# Patient Record
Sex: Female | Born: 2000 | Race: White | Hispanic: No | Smoking: Current every day smoker
Health system: Southern US, Community
[De-identification: ages and names within clinical notes are randomized; demographics above are authoritative.]

## PROBLEM LIST (undated history)

## (undated) DIAGNOSIS — M419 Scoliosis, unspecified: Secondary | ICD-10-CM

---
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
## 2018-06-01 ENCOUNTER — Emergency Department (HOSPITAL_BASED_OUTPATIENT_CLINIC_OR_DEPARTMENT_OTHER)
Admission: EM | Admit: 2018-06-01 | Discharge: 2018-06-01 | Disposition: A | Discharge status: Home / Self Care | Payer: 59 | Attending: Emergency Medicine | Admitting: Emergency Medicine

## 2018-06-01 ENCOUNTER — Other Ambulatory Visit: Payer: Self-pay

## 2018-06-01 ENCOUNTER — Encounter (HOSPITAL_BASED_OUTPATIENT_CLINIC_OR_DEPARTMENT_OTHER): Payer: Self-pay

## 2018-06-01 ENCOUNTER — Emergency Department (HOSPITAL_BASED_OUTPATIENT_CLINIC_OR_DEPARTMENT_OTHER): Payer: 59

## 2018-06-01 DIAGNOSIS — M25571 Pain in right ankle and joints of right foot: Secondary | ICD-10-CM

## 2018-06-01 DIAGNOSIS — F17200 Nicotine dependence, unspecified, uncomplicated: Secondary | ICD-10-CM | POA: Insufficient documentation

## 2018-06-01 DIAGNOSIS — W19XXXA Unspecified fall, initial encounter: Secondary | ICD-10-CM

## 2018-06-01 DIAGNOSIS — W108XXA Fall (on) (from) other stairs and steps, initial encounter: Secondary | ICD-10-CM | POA: Insufficient documentation

## 2018-06-01 MED ORDER — IBUPROFEN 800 MG PO TABS
800.0000 mg | ORAL_TABLET | Freq: Once | ORAL | Status: AC
  Administered 2018-06-01: 800 mg via ORAL
  Filled 2018-06-01: qty 1

## 2018-06-01 NOTE — ED Triage Notes (Signed)
Pt fell down steps approx 20 min PTA-pain to right ankle-NAD-to triage in w/c

## 2018-06-01 NOTE — Discharge Instructions (Addendum)
1. Medications: Take ibuprofen 3 times a day with meals.  Do not take other anti-inflammatories at the same time (Advil, Motrin, naproxen, Aleve). You may supplement with Tylenol if you need further pain control. 2. Treatment: Wear ankle brace for 1-2 weeks for stabilization of ankle. Use crutches as needed for comfort. Ice and elevate ankle throughout the day. 3. Follow-up: follow up with your primary care doctor in 1 week if you pain is not improving.  Return to the ER if your develop any new, worsening, or concerning symptoms.

## 2018-06-02 NOTE — ED Provider Notes (Signed)
[facility] EMERGENCY DEPARTMENT Provider Note   CSN: [id number] Arrival date & time: 06/01/18  2016     History   Chief Complaint Chief Complaint  Patient presents with  . Fall    HPI Rachel Sawyer is a 17 y.o. female presenting for evaluation of right ankle pain after a fall.  Patient states she was going upstairs when she missed a step, causing her to fall and she thinks he landed on her right ankle.  She reports acute onset of right ankle pain, which is been constant since.  This happened just prior to arrival.  She denies pain elsewhere.  She denies hitting her head or loss of consciousness.  She denies headache, vision changes, neck pain, back pain, chest pain, or abdominal pain.  She has pain on the left side.  Pain is of the lateral right ankle.  She has not attempted to ambulate since.  She has not taken anything for pain including Tylenol or ibuprofen.  Pain is constant, nothing makes it better or worse.  She has no other medical problems, takes no medications daily.  She is not on blood thinners.  No radiation of the pain.  HPI  History reviewed. No pertinent past medical history.  There are no active problems to display for this patient.   History reviewed. No pertinent surgical history.   OB History   None      Home Medications    Prior to Admission medications   Not on File    Family History No family history on file.  Social History Social History   Tobacco Use  . Smoking status: Current Every Day Smoker  . Smokeless tobacco: Never Used  Substance Use Topics  . Alcohol use: Never    Frequency: Never  . Drug use: Never     Allergies   Patient has no known allergies.   Review of Systems Review of Systems  Musculoskeletal: Positive for arthralgias.  Neurological: Negative for numbness.  Hematological: Does not bruise/bleed easily.     Physical Exam Updated Vital Signs BP (!) 124/91 (BP Location: Right Arm)   Pulse  81   Temp 98.9 F (37.2 C) (Oral)   Resp 18   Ht 5\' 5"  (1.651 m)   Wt 54.4 kg   LMP 05/08/2018   SpO2 100%   BMI 19.97 kg/m   Physical Exam  Constitutional: She is oriented to person, place, and time. She appears well-developed and well-nourished. No distress.  HENT:  Head: Normocephalic and atraumatic.  Eyes: EOM are normal.  Neck: Normal range of motion.  Pulmonary/Chest: Effort normal.  Abdominal: She exhibits no distension.  Musculoskeletal: Normal range of motion. She exhibits tenderness. She exhibits no edema.  No obvious swelling or deformity of the right ankle.  Tenderness palpation of the lateral malleolus.  Pedal pulses intact bilaterally.  Sensation intact bilaterally.  Good cap refill.  Increased pain with plantar flexion, no pain with other movements of the ankle.  Attempted to bear weight, patient reports significant increased pain.  Neurological: She is alert and oriented to person, place, and time. No sensory deficit.  Skin: Skin is warm. Capillary refill takes less than 2 seconds. No rash noted.  Psychiatric: She has a normal mood and affect.  Nursing note and vitals reviewed.    ED Treatments / Results  Labs (all labs ordered are listed, but only abnormal results are displayed) Labs Reviewed - No data to display  EKG None  Radiology Dg Ankle  Complete Right  Result Date: 06/01/2018 CLINICAL DATA:  Larey Seat down steps EXAM: RIGHT ANKLE - COMPLETE 3+ VIEW COMPARISON:  None. FINDINGS: There is no evidence of fracture, dislocation, or joint effusion. There is no evidence of arthropathy or other focal bone abnormality. Soft tissues are unremarkable. IMPRESSION: Negative. Electronically Signed   By: Jasmine Pang M.D.   On: 06/01/2018 21:38    Procedures Procedures (including critical care time)  Medications Ordered in ED Medications  ibuprofen (ADVIL,MOTRIN) tablet 800 mg (800 mg Oral Given 06/01/18 2309)     Initial Impression / Assessment and Plan / ED Course   I have reviewed the triage vital signs and the nursing notes.  Pertinent labs & imaging results that were available during my care of the patient were reviewed by me and considered in my medical decision making (see chart for details).     Patient presenting for evaluation of right ankle pain after fall.  Physical exam reassuring, she is neurovascularly intact.  No obvious deformities.  X-ray viewed interpreted by me, no fractures or dislocations.  Likely sprain.  Patient reporting difficulty bearing weight due to pain.  Will give ASO, crutches.  Dicussed use of NSAIDs, rest, ice, and elevation.  Discussed follow-up with primary care doctor if symptoms are not improving.  At this time, patient appears safe for discharge.  Return precautions given.  Patient states she understands and agrees plan.   Final Clinical Impressions(s) / ED Diagnoses   Final diagnoses:  Fall, initial encounter  Acute right ankle pain    ED Discharge Orders    None       Alveria Apley, PA-C 06/02/18 Laroy Apple, MD 06/08/18 [phone]

## 2019-08-17 ENCOUNTER — Other Ambulatory Visit: Payer: Self-pay

## 2019-08-17 ENCOUNTER — Emergency Department (HOSPITAL_COMMUNITY): Payer: BC Managed Care – PPO

## 2019-08-17 ENCOUNTER — Encounter (HOSPITAL_COMMUNITY): Payer: Self-pay | Admitting: Emergency Medicine

## 2019-08-17 ENCOUNTER — Emergency Department (HOSPITAL_COMMUNITY)
Admission: EM | Admit: 2019-08-17 | Discharge: 2019-08-18 | Disposition: A | Discharge status: Home / Self Care | Payer: BC Managed Care – PPO | Attending: Emergency Medicine | Admitting: Emergency Medicine

## 2019-08-17 DIAGNOSIS — R0789 Other chest pain: Secondary | ICD-10-CM

## 2019-08-17 DIAGNOSIS — R0602 Shortness of breath: Secondary | ICD-10-CM | POA: Insufficient documentation

## 2019-08-17 DIAGNOSIS — Z79899 Other long term (current) drug therapy: Secondary | ICD-10-CM | POA: Insufficient documentation

## 2019-08-17 DIAGNOSIS — F17210 Nicotine dependence, cigarettes, uncomplicated: Secondary | ICD-10-CM | POA: Insufficient documentation

## 2019-08-17 DIAGNOSIS — R079 Chest pain, unspecified: Secondary | ICD-10-CM | POA: Diagnosis present

## 2019-08-17 DIAGNOSIS — Z20828 Contact with and (suspected) exposure to other viral communicable diseases: Secondary | ICD-10-CM | POA: Insufficient documentation

## 2019-08-17 DIAGNOSIS — U071 COVID-19: Secondary | ICD-10-CM

## 2019-08-17 HISTORY — DX: Scoliosis, unspecified: M41.9

## 2019-08-17 LAB — CBC
HCT: 42.9 % (ref 36.0–46.0)
Hemoglobin: 14.4 g/dL (ref 12.0–15.0)
MCH: 30.9 pg (ref 26.0–34.0)
MCHC: 33.6 g/dL (ref 30.0–36.0)
MCV: 92.1 fL (ref 80.0–100.0)
Platelets: 280 10*3/uL (ref 150–400)
RBC: 4.66 MIL/uL (ref 3.87–5.11)
RDW: 13.1 % (ref 11.5–15.5)
WBC: 3.6 10*3/uL — ABNORMAL LOW (ref 4.0–10.5)
nRBC: 0 % (ref 0.0–0.2)

## 2019-08-17 LAB — BASIC METABOLIC PANEL
Anion gap: 9 (ref 5–15)
BUN: <5 mg/dL — ABNORMAL LOW (ref 6–20)
CO2: 23 mmol/L (ref 22–32)
Calcium: 8.9 mg/dL (ref 8.9–10.3)
Chloride: 105 mmol/L (ref 98–111)
Creatinine, Ser: 0.68 mg/dL (ref 0.44–1.00)
GFR calc Af Amer: >60 mL/min (ref >60)
GFR calc non Af Amer: >60 mL/min (ref >60)
Glucose, Bld: 91 mg/dL (ref 70–99)
Potassium: 3.8 mmol/L (ref 3.5–5.1)
Sodium: 137 mmol/L (ref 135–145)

## 2019-08-17 LAB — TROPONIN I (HIGH SENSITIVITY): Troponin I (High Sensitivity): <2 ng/L (ref <18)

## 2019-08-17 LAB — I-STAT BETA HCG BLOOD, ED (MC, WL, AP ONLY): I-stat hCG, quantitative: <5 m[IU]/mL (ref <5)

## 2019-08-17 MED ORDER — IOHEXOL 350 MG/ML SOLN
80.0000 mL | Freq: Once | INTRAVENOUS | Status: AC | PRN
  Administered 2019-08-17: 80 mL via INTRAVENOUS

## 2019-08-17 MED ORDER — MORPHINE SULFATE (PF) 4 MG/ML IV SOLN
4.0000 mg | Freq: Once | INTRAVENOUS | Status: AC
  Administered 2019-08-17: 23:00:00 4 mg via INTRAVENOUS
  Filled 2019-08-17: qty 1

## 2019-08-17 MED ORDER — KETOROLAC TROMETHAMINE 30 MG/ML IJ SOLN
30.0000 mg | Freq: Once | INTRAMUSCULAR | Status: DC
  Filled 2019-08-17: qty 1

## 2019-08-17 MED ORDER — ONDANSETRON HCL 4 MG/2ML IJ SOLN
4.0000 mg | Freq: Once | INTRAMUSCULAR | Status: AC
  Administered 2019-08-17: 23:00:00 4 mg via INTRAVENOUS
  Filled 2019-08-17: qty 2

## 2019-08-17 MED ORDER — SODIUM CHLORIDE 0.9 % IV BOLUS
1000.0000 mL | Freq: Once | INTRAVENOUS | Status: AC
  Administered 2019-08-17: 1000 mL via INTRAVENOUS

## 2019-08-17 MED ORDER — SODIUM CHLORIDE 0.9% FLUSH: 3.0000 mL | Freq: Once | INTRAVENOUS | Status: DC

## 2019-08-17 NOTE — ED Triage Notes (Signed)
Patient reports positive Covid test yesterday presents with central chest pain with SOB and dry cough , denies fever or chills .

## 2019-08-17 NOTE — ED Provider Notes (Signed)
[facility] EMERGENCY DEPARTMENT Provider Note   CSN: [id number] Arrival date & time: 08/17/19  1940     History   Chief Complaint Chief Complaint  Patient presents with   COVID+   Chest Pain    HPI Rachel Sawyer is a 18 y.o. female with PMH/o scoliosis, Marfan trait who presents for evaluation of chest pain that began yesterday.  She reports that she was recently diagnosed with Covid yesterday.  She reports that later that afternoon, she started developing some right-sided chest pain.  She describes the pain as sharp and states it starts in her left anterior chest and wraps around to her left side.  She said is intermittent and not associated with any particular action.  She states that it is a sharp worsening pain with deep inspiration she feels like she cannot get a deep breath in or breathe well because of the pain.  Nothing else makes the pain worse. She states she has not taken anything for the pain.  She has sometimes gotten nauseous but denies any vomiting, diaphoresis.  She has not had any cough.  She does report that she has had fever and has been taking Tylenol for fever.  She vapes but does not smoke.  She does have history of Marfan trait.  She is not on any oral birth control pills, denies any recent travel, recent surgeries or hospitalizations, history of PEs or DVTs.   HPI: A 18 year old patient presents for evaluation of chest pain. Initial onset of pain was less than one hour ago. The patient's chest pain is well-localized, is sharp and is not worse with exertion. The patient complains of nausea. The patient's chest pain is middle- or left-sided, is not described as heaviness/pressure/tightness and does not radiate to the arms/jaw/neck. The patient denies diaphoresis. The patient has no history of stroke, has no history of peripheral artery disease, has not smoked in the past 90 days, denies any history of treated diabetes, has no relevant family  history of coronary artery disease (first degree relative at less than age 20), is not hypertensive, has no history of hypercholesterolemia and does not have an elevated BMI (>=30).   The history is provided by the patient.    Past Medical History:  Diagnosis Date   Scoliosis     There are no active problems to display for this patient.   History reviewed. No pertinent surgical history.   OB History   No obstetric history on file.      Home Medications    Prior to Admission medications   Medication Sig Start Date End Date Taking? Authorizing Provider  acetaminophen (TYLENOL) 325 MG tablet Take 650 mg by mouth every 6 (six) hours as needed for mild pain or fever.   Yes [provider]  baclofen (LIORESAL) 20 MG tablet Take 10-20 mg by mouth daily as needed. 10/14/16  Yes [provider]  Burr MedicoXULANE 150-35 MCG/24HR transdermal patch Place 1 patch onto the skin See admin instructions. Apply 1 patch transdermally every Wednesday- skip week 4, repeat (unless otherwise instructed) 07/30/19  Yes [provider]  albuterol (VENTOLIN HFA) 108 (90 Base) MCG/ACT inhaler Inhale 1-2 puffs into the lungs every 6 (six) hours as needed for wheezing or shortness of breath. 08/18/19   McDonald, Mia A, PA-C  ondansetron (ZOFRAN ODT) 4 MG disintegrating tablet Take 1 tablet (4 mg total) by mouth every 8 (eight) hours as needed. 08/18/19   McDonald, Mia A, PA-C  promethazine-dextromethorphan (  PROMETHAZINE-DM) 6.25-15 MG/5ML syrup Take 5 mLs by mouth 4 (four) times daily as needed for cough. 08/18/19   McDonald, Mia A, PA-C  Spacer/Aero-Holding Dorise Bullion Use each time you use the albuterol inhaler you were prescribed. 08/18/19   McDonald, Mia A, PA-C    Family History No family history on file.  Social History Social History   Tobacco Use   Smoking status: Current Every Day Smoker   Smokeless tobacco: Never Used  Substance Use Topics   Alcohol use: Never     Frequency: Never   Drug use: Never     Allergies   Patient has no known allergies.   Review of Systems Review of Systems  Constitutional: Negative for fever.  Respiratory: Positive for shortness of breath. Negative for cough.   Cardiovascular: Positive for chest pain.  Gastrointestinal: Negative for abdominal pain, nausea and vomiting.  Genitourinary: Negative for dysuria and hematuria.  Neurological: Negative for headaches.     Physical Exam Updated Vital Signs BP 111/77    Pulse 78    Temp 98.6 F (37 C) (Oral)    Resp 17    LMP 07/14/2019 (Approximate)    SpO2 100%   Physical Exam Vitals signs and nursing note reviewed.  Constitutional:      Appearance: Normal appearance. She is well-developed.  HENT:     Head: Normocephalic and atraumatic.  Eyes:     General: Lids are normal.     Conjunctiva/sclera: Conjunctivae normal.     Pupils: Pupils are equal, round, and reactive to light.  Neck:     Musculoskeletal: Full passive range of motion without pain.  Cardiovascular:     Rate and Rhythm: Normal rate and regular rhythm.     Pulses: Normal pulses.          Radial pulses are 2+ on the right side and 2+ on the left side.       Dorsalis pedis pulses are 2+ on the right side and 2+ on the left side.     Heart sounds: Normal heart sounds. No murmur. No friction rub. No gallop.   Pulmonary:     Effort: Pulmonary effort is normal.     Breath sounds: Normal breath sounds.     Comments: Lungs clear to auscultation bilaterally.  Symmetric chest rise.  No wheezing, rales, rhonchi. No evidence of respiratory distress.  Abdominal:     Palpations: Abdomen is soft. Abdomen is not rigid.     Tenderness: There is no abdominal tenderness. There is no guarding.     Comments: Abdomen is soft, non-distended, non-tender. No rigidity, No guarding. No peritoneal signs.  Musculoskeletal: Normal range of motion.     Comments: Bilateral lower extremities are symmetric in appearance without  any abnormalities.  Skin:    General: Skin is warm and dry.     Capillary Refill: Capillary refill takes less than 2 seconds.  Neurological:     Mental Status: She is alert and oriented to person, place, and time.  Psychiatric:        Speech: Speech normal.      ED Treatments / Results  Labs (all labs ordered are listed, but only abnormal results are displayed) Labs Reviewed  BASIC METABOLIC PANEL - Abnormal; Notable for the following components:      Result Value   BUN <5 (*)    All other components within normal limits  CBC - Abnormal; Notable for the following components:   WBC 3.6 (*)  All other components within normal limits  I-STAT BETA HCG BLOOD, ED ([facility], [facility], [facility] ONLY)  TROPONIN I (HIGH SENSITIVITY)  TROPONIN I (HIGH SENSITIVITY)    EKG EKG Interpretation  Date/Time:  Wednesday August 17 2019 19:49:58 EST Ventricular Rate:  108 PR Interval:  112 QRS Duration: 68 QT Interval:  318 QTC Calculation: 426 R Axis:   98 Text Interpretation: Sinus tachycardia Rightward axis ST & T wave abnormality, consider inferior ischemia ST & T wave abnormality, consider anterior ischemia Abnormal ECG No old tracing to compare Confirmed by Meridee Score [id number]) on 08/17/2019 9:24:39 PM   Radiology Ct Angio Chest Pe W/cm &/or Wo Cm  Result Date: 08/17/2019 CLINICAL DATA:  Chest pain and shortness of breath. COVID-19 positive yesterday. Chest pain, complex, intermediate/high prob of ACS/PE/AAS EXAM: CT ANGIOGRAPHY CHEST WITH CONTRAST TECHNIQUE: Multidetector CT imaging of the chest was performed using the standard protocol during bolus administration of intravenous contrast. Multiplanar CT image reconstructions and MIPs were obtained to evaluate the vascular anatomy. CONTRAST:  70mL OMNIPAQUE IOHEXOL 350 MG/ML SOLN COMPARISON:  Radiograph earlier this day. FINDINGS: Cardiovascular: There are no filling defects within the pulmonary arteries to suggest pulmonary embolus. The thoracic  aorta is normal in caliber. Cannot assess for dissection given phase of contrast bolus timing tailored to pulmonary artery evaluation. Normal heart size without pericardial effusion. Mediastinum/Nodes: No enlarged mediastinal or hilar lymph nodes. No visualized thyroid nodule. Esophagus decompressed. Minimal residual thymus in the anterior mediastinum. Lungs/Pleura: Clear lungs. No focal consolidation or ground-glass opacity. No pulmonary edema or pleural fluid. The trachea and mainstem bronchi are patent. Upper Abdomen: No acute abnormality. Musculoskeletal: There are no acute or suspicious osseous abnormalities. Small bone island in midthoracic vertebra. Slight scoliotic curvature of the upper thoracic spine. Review of the MIP images confirms the above findings. IMPRESSION: Negative chest CTA. No pulmonary embolus or acute intrathoracic abnormality. Electronically Signed   By: Narda Rutherford M.D.   On: 08/17/2019 23:31   Dg Chest Portable 1 View  Result Date: 08/17/2019 CLINICAL DATA:  Chest pain EXAM: PORTABLE CHEST 1 VIEW COMPARISON:  None FINDINGS: The heart size and mediastinal contours are within normal limits. Both lungs are clear. The visualized skeletal structures are unremarkable. IMPRESSION: No active disease. Electronically Signed   By: Jasmine Pang M.D.   On: 08/17/2019 21:04    Procedures Procedures (including critical care time)  Medications Ordered in ED Medications  ondansetron (ZOFRAN) injection 4 mg (4 mg Intravenous Given 08/17/19 2252)  morphine 4 MG/ML injection 4 mg (4 mg Intravenous Given 08/17/19 2253)  sodium chloride 0.9 % bolus 1,000 mL (0 mLs Intravenous Stopped 08/18/19 0029)  iohexol (OMNIPAQUE) 350 MG/ML injection 80 mL (80 mLs Intravenous Contrast Given 08/17/19 2320)     Initial Impression / Assessment and Plan / ED Course  I have reviewed the triage vital signs and the nursing notes.  Pertinent labs & imaging results that were available during my care of  the patient were reviewed by me and considered in my medical decision making (see chart for details).     HEAR Score: 49  18 year old female who presents for evaluation of chest pain that began yesterday.  Recently diagnosed with Covid.  Reports fever at home.  States chest pain is worse with deep inspiration and wraps around from the front part of her chest to her left side.  She vapes but does not smoke anything else.  Initially arrival, she is afebrile but is tachycardic.  Vitals otherwise stable.  No evidence of hypoxia.  Consider infectious symptoms secondary to Covid infection.  History/physical exam is not concerning for dissection.  She has equal pulses in all 4 extremities, she is nontoxic-appearing and sitting comfortably in bed with no signs of acute distress.  She is hemodynamically stable.  My suspicion for ACS etiology is low as it sounds atypical in nature.  Low suspicion for PE but it is a consideration given history of Covid and known hypercoagulability.  Plan for labs, EKG, chest x-ray.  Given her history of Marfan, Covid, will plan for CTA for evaluation of any acute abnormality.  I-STAT beta negative.  Initial troponin negative.  CBC shows leukopenia of 3.6.  Otherwise stable.  BMP is unremarkable.  Chest x-ray negative for any acute factious etiology.  CTA shows no evidence of PE.  He denies any obvious aortic abnormality but due to contrast cannot completely rule out dissection.  At this time, she is sitting comfortably and is hemodynamically stable.  I do not suspect dissection as the source of her symptoms.  Her story sounds atypical for ACS etiology and given her lack of risk factors, I have low suspicion.  Her EKG shows some diffuse ST/T wave changes but does have wandering baseline.  We will plan to repeat and get a delta troponin.  Given her history/risk factors, she has a heart score of 1.  Patient signed out to Frederik Pear, PA-C with delta troponin and EKG pending.  If  repeat EKG is unremarkable, delta troponin is negative, anticipate that she can be discharged home.  At this time, she is hemodynamically stable, resting comfortably on bed.  CTA is not concerning for PE and her history/physical exam is not concerning for dissection. I suspect her symptoms may be related to COVID-19 infectious process.   Rachel Sawyer was evaluated in Emergency Department on 08/19/2019 for the symptoms described in the history of present illness. She was evaluated in the context of the global COVID-19 pandemic, which necessitated consideration that the patient might be at risk for infection with the SARS-CoV-2 virus that causes COVID-19. Institutional protocols and algorithms that pertain to the evaluation of patients at risk for COVID-19 are in a state of rapid change based on information released by regulatory bodies including the CDC and federal and state organizations. These policies and algorithms were followed during the patient's care in the ED.  Portions of this note were generated with Scientist, clinical (histocompatibility and immunogenetics). Dictation errors may occur despite best attempts at proofreading.   Final Clinical Impressions(s) / ED Diagnoses   Final diagnoses:  Atypical chest pain  COVID-19    ED Discharge Orders         Ordered    Spacer/Aero-Holding [facility]     08/18/19 0136    albuterol (VENTOLIN HFA) 108 (90 Base) MCG/ACT inhaler  Every 6 hours PRN     08/18/19 0136    ondansetron (ZOFRAN ODT) 4 MG disintegrating tablet  Every 8 hours PRN     08/18/19 0136    promethazine-dextromethorphan (PROMETHAZINE-DM) 6.25-15 MG/5ML syrup  4 times daily PRN     08/18/19 0136           Maxwell Caul, PA-C 08/19/19 1142    Terrilee Files, MD 08/19/19 1157

## 2019-08-18 LAB — TROPONIN I (HIGH SENSITIVITY): Troponin I (High Sensitivity): <2 ng/L (ref <18)

## 2019-08-18 MED ORDER — ALBUTEROL SULFATE HFA 108 (90 BASE) MCG/ACT IN AERS: 1.0000 | INHALATION_SPRAY | Freq: Four times a day (QID) | RESPIRATORY_TRACT | 0 refills | Status: AC | PRN

## 2019-08-18 MED ORDER — PROMETHAZINE-DM 6.25-15 MG/5ML PO SYRP: 5.0000 mL | ORAL_SOLUTION | Freq: Four times a day (QID) | ORAL | 0 refills | Status: AC | PRN

## 2019-08-18 MED ORDER — ONDANSETRON 4 MG PO TBDP: 4.0000 mg | ORAL_TABLET | Freq: Three times a day (TID) | ORAL | 0 refills | Status: AC | PRN

## 2019-08-18 MED ORDER — SPACER/AERO-HOLDING CHAMBERS DEVI: 0 refills | Status: AC

## 2019-08-18 NOTE — Discharge Instructions (Addendum)
Take 650 mg of Tylenol or 600 mg of ibuprofen with food every 6 hours for pain.  You can alternate between these 2 medications every 3 hours if your pain returns.  For instance, you can take Tylenol at noon, followed by a dose of ibuprofen at 3, followed by second dose of Tylenol and 6.  Make sure you are staying hydrated drinking plenty of fluids.  Let 1 tablet of Zofran dissolve under tongue every 8 hours as needed for nausea or vomiting.  You can swallow 5 mL of Promethazine DM every 6 hours as needed for cough.  Use 2 puffs of the albuterol inhaler with a spacer to help with shortness of breath.  Patient isolate and quarantine yourself for the next 2 weeks from when your symptoms began  Return the emergency department for any worsening chest pain, difficulty breathing, inability to eat or drink or any other worsening concerning symptoms.

## 2019-08-18 NOTE — ED Notes (Signed)
Patient verbalizes understanding of discharge instructions. Opportunity for questioning and answers were provided. Armband removed by staff, pt discharged from ED.  

## 2019-08-18 NOTE — ED Provider Notes (Signed)
18 year old female received at signout from Rachel Sawyer pending repeat EKG and second troponin. Per her HPI:   "Rachel Sawyer is a 18 y.o. female with PMH/o scoliosis, Marfan trait who presents for evaluation of chest pain that began yesterday.  She reports that she was recently diagnosed with Covid yesterday.  She reports that later that afternoon, she started developing some right-sided chest pain.  She describes the pain as sharp and states it starts in her left anterior chest and wraps around to her left side.  She said is intermittent and not associated with any particular action.  She states that it is a sharp worsening pain with deep inspiration she feels like she cannot get a deep breath in or breathe well because of the pain.  Nothing else makes the pain worse. She states she has not taken anything for the pain.  She has sometimes gotten nauseous but denies any vomiting, diaphoresis.  She has not had any cough.  She does report that she has had fever and has been taking Tylenol for fever.  She vapes but does not smoke.  She does have history of Marfan trait.  She is not on any oral birth control pills, denies any recent travel, recent surgeries or hospitalizations, history of PEs or DVTs.   HPI: A 18 year old patient presents for evaluation of chest pain. Initial onset of pain was less than one hour ago. The patient's chest pain is well-localized, is sharp and is not worse with exertion. The patient complains of nausea. The patient's chest pain is middle- or left-sided, is not described as heaviness/pressure/tightness and does not radiate to the arms/jaw/neck. The patient denies diaphoresis. The patient has no history of stroke, has no history of peripheral artery disease, has not smoked in the past 90 days, denies any history of treated diabetes, has no relevant family history of coronary artery disease (first degree relative at less than age 45), is not hypertensive, has no history of  hypercholesterolemia and does not have an elevated BMI (>=30)."   Physical Exam  BP 111/77   Pulse 78   Temp 98.6 F (37 C) (Oral)   Resp 17   LMP 07/14/2019 (Approximate)   SpO2 100%   Physical Exam Vitals signs and nursing note reviewed.  Constitutional:      General: She is not in acute distress.    Appearance: She is well-developed.  HENT:     Head: Normocephalic and atraumatic.  Eyes:     Conjunctiva/sclera: Conjunctivae normal.  Neck:     Musculoskeletal: Normal range of motion and neck supple.     Trachea: No tracheal deviation.  Cardiovascular:     Rate and Rhythm: Normal rate.  Pulmonary:     Effort: Pulmonary effort is normal. No respiratory distress.     Comments: Speaks in complete, fluent sentences without increased work of breathing. Abdominal:     General: There is no distension.  Musculoskeletal: Normal range of motion.  Skin:    General: Skin is warm and dry.  Neurological:     Mental Status: She is alert and oriented to person, place, and time.  Psychiatric:        Behavior: Behavior normal.      ED Course/Procedures     Procedures  EKG Interpretation  Date/Time:  Thursday August 18 2019 00:28:21 EST Ventricular Rate:  65 PR Interval:  112 QRS Duration: 82 QT Interval:  418 QTC Calculation: 435 R Axis:   89 Text Interpretation: Sinus rhythm  Borderline T abnormalities, anterior leads Confirmed by [name] [phone]) on 08/18/2019 12:46:31 AM   MDM   18 year old female received at sign out from Rachel Sawyer pending repeat troponin and repeat EKG. Please see her note for further work-up and medical decision making.  Repeat EKG with borderline T wave abnormalities and sinus rhythm; however, repeat troponin is flat.  Suspect symptoms are secondary to recent diagnosis of COVID-19.  All questions answered.  We will discharge the patient home with symptomatic prescriptions.  She is hemodynamically stable and in no acute distress.  ER return  precautions given.  Safe for discharge to home.    Joline Maxcy A, PA-C 08/18/19 [id number]    [name], MD 08/18/19 [phone]

## 2020-12-04 IMAGING — DX DG CHEST 1V PORT
1 series · 2 of 2 positions shown · non-contrast
Comparison: None

CLINICAL DATA: Chest pain

EXAM:
PORTABLE CHEST 1 VIEW

[Series 1: chest · 0.14mm/px · 2 of 2 slices shown]
[im 1/2]
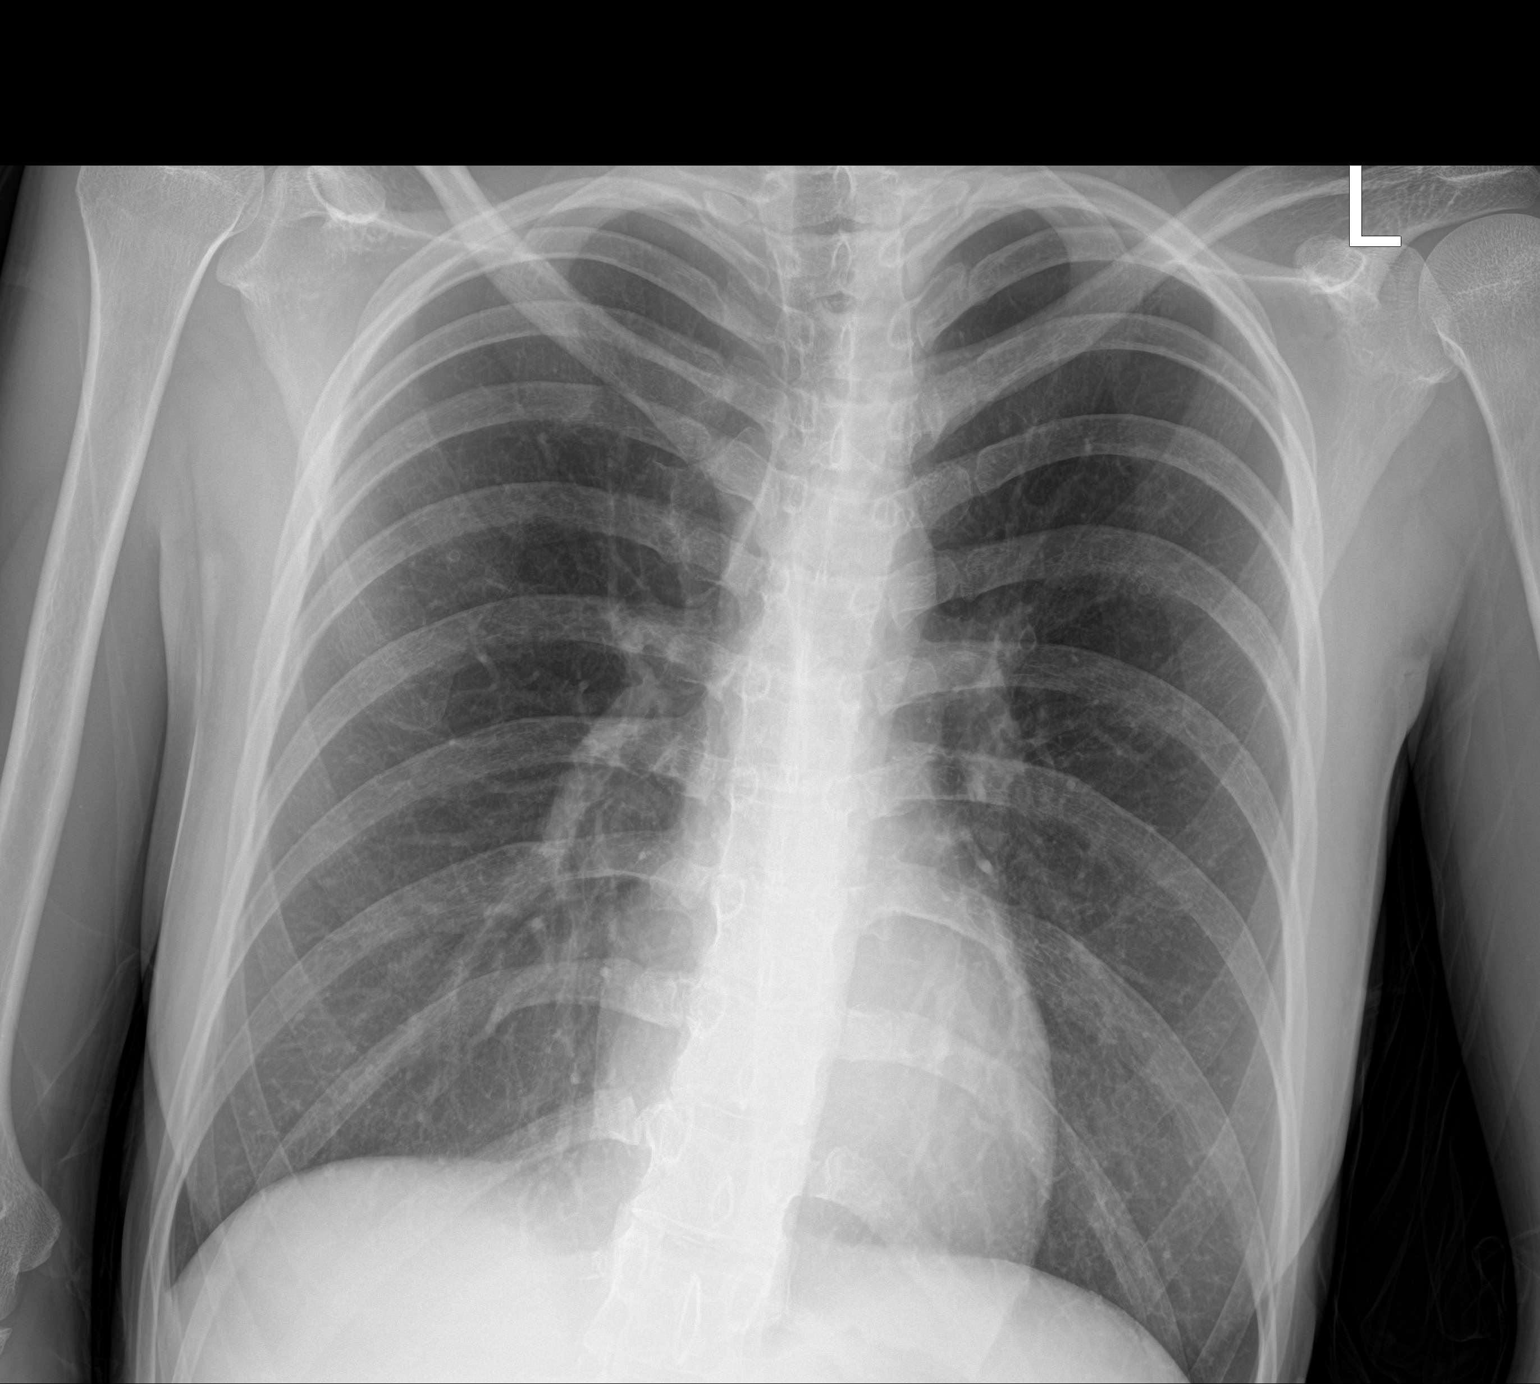
[im 2/2]
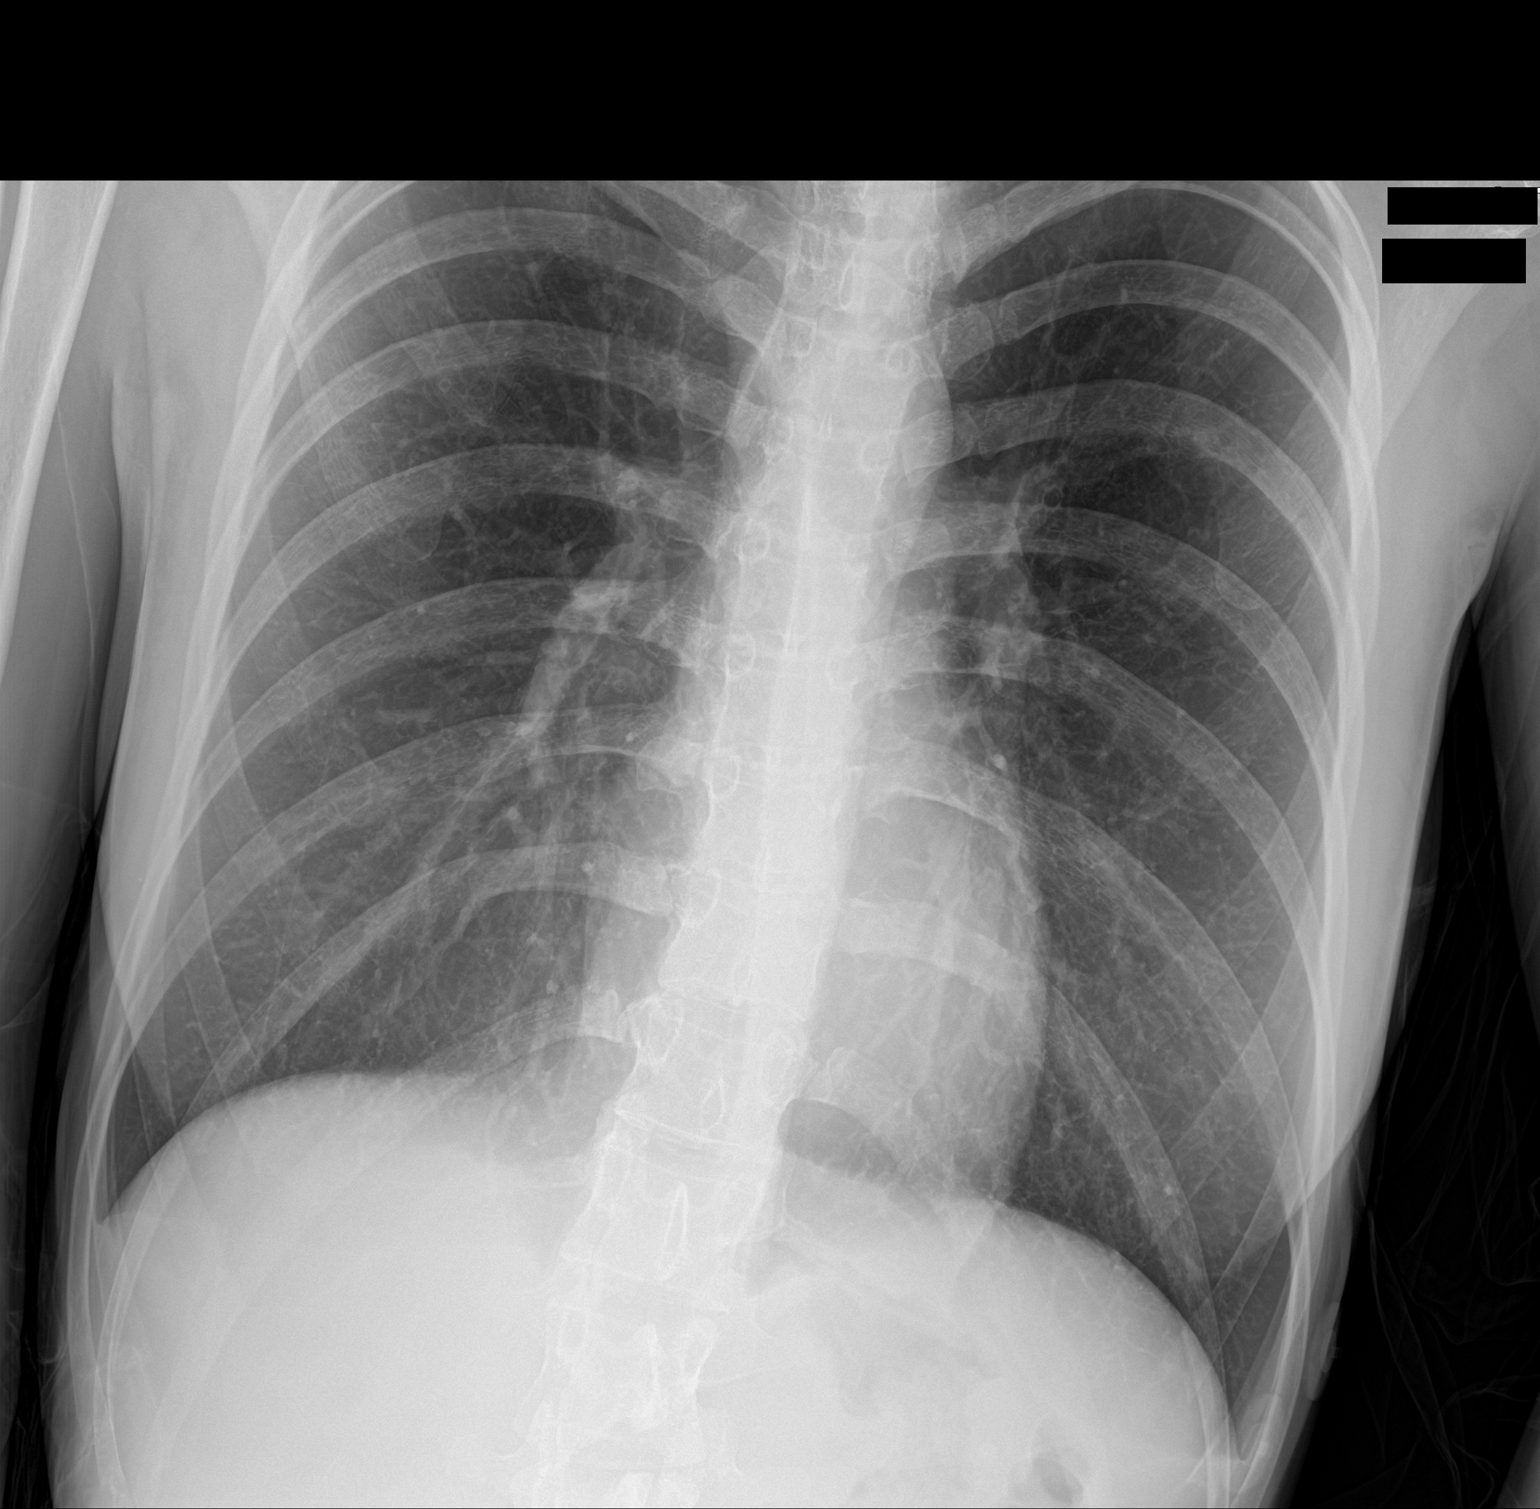

[2 of 2 positions shown; findings below may reference images not displayed]

FINDINGS: The heart size and mediastinal contours are within normal limits.
Both lungs are clear. The visualized skeletal structures are
unremarkable.
IMPRESSION: No active disease.

## 2021-02-12 ENCOUNTER — Other Ambulatory Visit: Payer: Self-pay

## 2021-02-12 ENCOUNTER — Encounter: Payer: BC Managed Care – PPO | Attending: Pediatrics | Admitting: Registered"

## 2021-02-12 ENCOUNTER — Encounter: Payer: Self-pay | Admitting: Registered"

## 2021-02-12 DIAGNOSIS — R634 Abnormal weight loss: Secondary | ICD-10-CM | POA: Diagnosis not present

## 2021-02-12 NOTE — Progress Notes (Signed)
Appointment start time: 9:55  Appointment end time: 10:53  Patient was seen on 02/12/2021 for nutrition counseling pertaining to disordered eating  Primary care provider: Andrey Cota, MD Therapist: none  ROI: N/A Any other medical team members: none   Assessment  Pt arrives stating she was told that she is very underweight for her age and has low cholesterol. States her appetite has changed since having COVID 07/2019. Reports she also gets out of breath easily and experiencing some hair loss.  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 States she has BM once/week. States this was her norm before having COVID.    Reports she has spells where if she doesn't eat all day she will have shaky spells and will feel like she's going to pass out. States she likes a variety of food items but gets full fast when eating. States sometimes she will not eat until 4 pm sometimes. States she is starving at dinner time but will be full after a few bites.   States she typically eats dinner with family. Will sometimes eat breakfast with boyfriend. Lives with mom, stepfather, brother, stepsibling, and boyfriend. States mom cooks dinner often because she has a big family.   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    Growth Metrics: Median BMI for age:  BMI today:  % median today:   Previous growth data: weight/age  ~50th %; height/age at 75-85th %; BMI/age 39-50th % Goal BMI range based on growth chart data: 25th % Goal weight range based on growth chart data: 126+ Goal rate of weight gain:  0.1-1.0 lb/week  Eating history: Length of time: 1.5 years ago; 07/2019 Previous treatments: none Goals for RD meetings: improve hair loss, dizziness/lightheadedness, headaches, heart racing, cold intolerance, and challenges with focus/concentration  Weight history:  Highest weight: 120-124  Lowest weight: 105 Most consistent weight: 110-112 What would you like to weigh: 124 How  has weight changed in the past year: decreased more and more   Medical Information:  Changes in hair, skin, nails since ED started: hair loss Chewing/swallowing difficulties: no Reflux or heartburn: yes, heartburn Trouble with teeth: no LMP without the use of hormones: 4/30  Weight at that point: 107 Effect of exercise on menses: N/A   Effect of hormones on menses: N/A Constipation, diarrhea: yes, constipation. BM once/week.  Dizziness/lightheadedness: yes, 1 time/month Headaches/body aches: yes, 1-2 times/month Heart racing/chest pain: yes, when going to new appointments or when nervous  Mood: good Sleep: sleeps about 6-8 hrs/night Focus/concentration: yes, sometimes Cold intolerance: yes Vision changes: no  Mental health diagnosis:    Dietary assessment: A typical day consists of 2 meals and 2 snacks  Safe foods include: potatoes, chicken, most fruits and vegetables, spaghetti Avoided foods include: beef, broccoli, rice  24 hour recall:  B (8:30 am): [place] - 2 bites of bacon, egg, and cheese biscuit + 1-2 fries + Dr. Reino Kent  (20 oz) S: 5-6 bbq chips  L: skipped  S: 10 crackers  D (7 pm): 3-4 bites chicken casserole (chicken, soup, crackers) + 1/2 c garlic potatoes + 4 bites green beans + 1 roll (cinnamon butter) S:  Beverages: Dr. Reino Kent (20 oz), sweet tea (4*12 oz; 48 oz), water (2*16 oz; 32 oz); 64+ oz   What Methods Do You Use To Control Your Weight (Compensatory behaviors)?  Tries to drink Boost protein drinks to help gain  weight. Drinks about once a week.   Estimated energy intake: 1000-1100 kcal  Nutrition  Diagnosis: NB-1.5 Disordered eating pattern As related to skipping meals.  As evidenced by dietary recall.  Intervention/Goals: Pt was educated and counseled on eating to nourish the body, signs/symptoms of not being adequately nourished, ways to increase nourishment, Rule of 3's, and meal planning. Discussed potentially feeling bloated, gastroparesis,  abdominal distention, and feelings of fullness when increasing intake. Pt agreed with goals listed. Goals: - Have juice with breakfast.  - Aim to have 3 meals a day + 3 snacks, eating about every 3 hours. Have Boost as lunch. - Aim to have 1/2 plate of starch/grain + 1/4 plate of protein + 1/4 plate of fruit/vegetable + lipid + dairy with each meal.  - Have Boost at 10 pm nightly.   Meal plan:    3 meals    2-3 snacks   Monitoring and Evaluation: Patient will follow up in 3 weeks.

## 2021-02-12 NOTE — Patient Instructions (Signed)
-   Have juice with breakfast.   - Aim to have 3 meals a day + 3 snacks, eating about every 3 hours. Have Boost as lunch.  - Aim to have 1/2 plate of starch/grain + 1/4 plate of protein + 1/4 plate of fruit/vegetable + lipid + dairy with each meal.   - Have Boost at 10 pm nightly.

## 2021-03-05 ENCOUNTER — Encounter: Payer: BC Managed Care – PPO | Attending: Pediatrics | Admitting: Registered"

## 2021-03-05 ENCOUNTER — Other Ambulatory Visit: Payer: Self-pay

## 2021-03-05 ENCOUNTER — Encounter: Payer: Self-pay | Admitting: Registered"

## 2021-03-05 DIAGNOSIS — Z713 Dietary counseling and surveillance: Secondary | ICD-10-CM | POA: Insufficient documentation

## 2021-03-05 DIAGNOSIS — R634 Abnormal weight loss: Secondary | ICD-10-CM | POA: Diagnosis present

## 2021-03-05 NOTE — Progress Notes (Signed)
Appointment start time: 11:08  Appointment end time: 11:35  Patient was seen on 03/05/2021 for nutrition counseling pertaining to disordered eating  Primary care provider: Andrey Cota, MD Therapist: none  ROI: N/A Any other medical team members: none   Assessment  Pt arrives stating she has started drinking Boost but it is too sweet and able to drink 1/2 at a time. States she has been having juice most mornings with breakfast. States having 3 meals/day has been harder. Reports eating patterns have gotten better. States she is now eating earlier around 10 am instead of waiting until 2 pm. States her appetite increases when her menstrual cycle starts which was last week. States she has been able to stay on same track since then.    Growth Metrics: Median BMI for age:  BMI today:  % median today:   Previous growth data: weight/age  ~50th %; height/age at 75-85th %; BMI/age 15-50th % Goal BMI range based on growth chart data: 25th % Goal weight range based on growth chart data: 126+ Goal rate of weight gain:  0.1-1.0 lb/week  Eating history: Length of time: 1.5 years ago; 07/2019 Previous treatments: none Goals for RD meetings: improve hair loss, dizziness/lightheadedness, headaches, heart racing, cold intolerance, and challenges with focus/concentration  Weight history:  Today's weight: 110.2 Highest weight: 120-124  Lowest weight: 105 Most consistent weight: 110-112 What would you like to weigh: 124 How has weight changed in the past year: decreased more and more   Medical Information:  Changes in hair, skin, nails since ED started: hair loss Chewing/swallowing difficulties: no Reflux or heartburn: no, has improved Trouble with teeth: no LMP without the use of hormones: 6/1  Weight at that point: 107 Effect of exercise on menses: N/A   Effect of hormones on menses: N/A Constipation, diarrhea: yes, constipation. BM once/week.  Dizziness/lightheadedness: no since previous  visit; usually 1 time/month Headaches/body aches: yes, 1-2 times/month always get them during menstrual cycle Heart racing/chest pain: no Mood: good; getting aggravated easily Sleep: sleeps about 6-8 hrs/night Focus/concentration: no, has improved Cold intolerance: yes Vision changes: no  Mental health diagnosis:    Dietary assessment: A typical day consists of 3 meals and 2-3 snacks  Safe foods include: potatoes, chicken, most fruits and vegetables, spaghetti Avoided foods include: beef, broccoli, rice  24 hour recall:  B (8:30 am): 2 waffles + syrup + OJ + water  S: snack size bbq pringles + honey bun + 3 potato skins  L: [employer]-large fries + McChicken + lg Sprite  S:   D (7 pm): [employer]-1/2 chicken (with lettuce, mayo) sandwich + 4 nuggets + lg fries + jr chocolate frosty + lg vanilla coke  S:  Beverages: water (3*16 oz; 48 oz), sweet tea (12 oz), Sprite (32 oz), vanilla coke (32 oz); 64+ oz   What Methods Do You Use To Control Your Weight (Compensatory behaviors)?  Tries to drink Boost protein drinks to help gain  weight. Drinks about once a week.   Estimated energy intake: 2800-2900 kcal  Nutrition Diagnosis: NB-1.5 Disordered eating pattern As related to skipping meals.  As evidenced by dietary recall.  Intervention/Goals: Pt was encouraged with positive changes from previous appt. Discussed continuing to aim for Rule of 3's. Pt agreed with goals listed. Goals: - Aim to drink 1/2 Boost between lunch and dinner and 1/2 before bedtime.  - Aim to have 3 meals a day + 3 snacks, eating about every 3 hours.  - Aim to have 1/2  plate of starch/grain + 1/4 plate of protein + 1/4 plate of fruit/vegetable + lipid + dairy with each meal.  - Keep up the great work!!  Meal plan:    3 meals    2-3 snacks   Monitoring and Evaluation: Patient will follow up in 5 weeks.

## 2021-03-05 NOTE — Patient Instructions (Signed)
-   Aim to drink 1/2 Boost between lunch and dinner and 1/2 before bedtime.   - Aim to have 3 meals a day + 3 snacks, eating about every 3 hours.   - Aim to have 1/2 plate of starch/grain + 1/4 plate of protein + 1/4 plate of fruit/vegetable + lipid + dairy with each meal.   - Keep up the great work!!

## 2021-04-09 ENCOUNTER — Ambulatory Visit: Payer: BC Managed Care – PPO | Admitting: Registered"
